# Patient Record
Sex: Female | Born: 1995 | Race: White | Hispanic: No | Marital: Single | Smoking: Never smoker
Health system: Southern US, Community
[De-identification: ages and names within clinical notes are randomized; demographics above are authoritative.]

---

## 2014-06-22 ENCOUNTER — Ambulatory Visit (INDEPENDENT_AMBULATORY_CARE_PROVIDER_SITE_OTHER): Payer: BC Managed Care – PPO | Admitting: Family Medicine

## 2014-06-22 ENCOUNTER — Ambulatory Visit (INDEPENDENT_AMBULATORY_CARE_PROVIDER_SITE_OTHER): Payer: BC Managed Care – PPO

## 2014-06-22 VITALS — BP 120/78 | HR 83 | Temp 98.3°F | Resp 16 | Ht 69.5 in | Wt 154.2 lb

## 2014-06-22 DIAGNOSIS — M24859 Other specific joint derangements of unspecified hip, not elsewhere classified: Secondary | ICD-10-CM

## 2014-06-22 DIAGNOSIS — R29898 Other symptoms and signs involving the musculoskeletal system: Secondary | ICD-10-CM

## 2014-06-22 MED ORDER — PREDNISONE 10 MG PO TABS: ORAL_TABLET | ORAL | Status: DC

## 2014-06-22 NOTE — Progress Notes (Signed)
   Subjective:    Patient ID: Jocelyn Bailey, female    DOB: [birth year], 18 y.o.   MRN: [id number]  HPI Jocelyn Bailey is a 18 year old female who presents with bilateral hip pain following an injury sustained when she was at basketball practice. She was running and doing conditioning drills. She notes that the right lateral hip "popped". After the second sprint she felt her left "hip pop". She notes that it was painful when it popped. She denies any hx of dislocated joints. She has not been doing anything new. Location of the pop was bilateral frontal and side of her hips. No known relieving factors. Aggravating factors include doing lungs or squats. She took some ibuprofen at noon, which did not help. She is a Medical laboratory scientific officer at [employer]. She also notes some bilateral lateral hip and pain along the IT bands. Denies any fevers, chills, loss of bladder or bowel, saddle anesthesia, numbness, tingling, or weakness.  Past medical, social, medications, and allergies were reviewed and are up-to-date in the chart. Review of Systems As per history of present illness, 7 point review of systems was performed is otherwise negative.    Objective:   Physical Exam BP 120/78  Pulse 83  Temp(Src) 98.3 F (36.8 C) (Oral)  Resp 16  Ht 5' 9.5" (1.765 m)  Wt 154 lb 4 oz (69.967 kg)  BMI 22.46 kg/m2  SpO2 98%  LMP 06/07/2014 GEN: The patient is well-developed well-nourished female and in no acute distress.  She is awake alert and oriented x3. SKIN: warm and well-perfused, no rash  EXTR: No lower extremity edema or calf tenderness Neuro: Strength 5/5 globally. Sensation intact throughout. DTRs 2/4 bilaterally. No focal deficits. Vasc: +2 bilateral distal pulses. No edema.  MSK: Examination of the lower extremity reveals full hip internal and external rotation is pain free bilaterally. She has some tenderness to palpation along the bilateral IT bands and over the bilateral greater trochanters just posteriorly.  Negative pelvic compression test. She also localizes some mild pain in her back around the L1-L2 lumbar spine located paraspinally. No bony tenderness. She has good strength distally.  X-ray of the pelvis was obtained which did not reveal any acute fracture or dislocation. There were no avulsion injuries injury seen.     Assessment & Plan:  1. Snapping hip syndrome, bilaterally  -Rx 6-day prednisone tapering burst. -Given patient information about Snapping Hip Syndrome -Recommend therapeutic stretching and strengthening exercises, she can discuss with her trainer -If sx persist or acutely worsen, then follow-up or go to the ED.  Dr. Joellyn Haff, DO Sports Medicine Fellow  Discussed with Dr. Conley Rolls.

## 2014-06-22 NOTE — Patient Instructions (Signed)
Medrol Dose Pak- 6 Day Course Instructions: (Prednisone 10mg tablets, #21 tablets)   Day 1 (60mg total): Take 2 tablets before breakfast, 1 tablet after lunch, 1 tablet after dinner, and 2 tablets at bedtime. *Take 6 tablets on the day when you receive your prescription, even if not able to take according to the Day 1 schedule.   Day 2 (50mg total): Take 1 tablet before breakfast, 1 tablet after lunch, 1 tablet after dinner, and 2 tablets at bedtime.  Day 3 (40mg total): Take 1 tablet before breakfast, 1 tablet after lunch, 1 tablet after dinner, and 1 tablet at bedtime.  Day 4 (30mg total): Take 1 tablet before breakfast, 1 tablet after lunch, 1 tablet after dinner.  Day 5 (20mg total): Take 1 tablet before breakfast, 1 tablet at bedtime.  Day 6 (10mg total): Take 1 tablet before breakfast. 

## 2015-01-21 ENCOUNTER — Other Ambulatory Visit (HOSPITAL_COMMUNITY)
Admission: RE | Admit: 2015-01-21 | Discharge: 2015-01-21 | Disposition: A | Discharge status: Home / Self Care | Payer: BC Managed Care – PPO | Source: Ambulatory Visit | Attending: Emergency Medicine | Admitting: Emergency Medicine

## 2015-01-21 ENCOUNTER — Emergency Department (INDEPENDENT_AMBULATORY_CARE_PROVIDER_SITE_OTHER)
Admission: EM | Admit: 2015-01-21 | Discharge: 2015-01-21 | Disposition: A | Discharge status: Home / Self Care | Payer: BC Managed Care – PPO | Source: Home / Self Care | Attending: Emergency Medicine | Admitting: Emergency Medicine

## 2015-01-21 ENCOUNTER — Encounter (HOSPITAL_COMMUNITY): Payer: Self-pay

## 2015-01-21 DIAGNOSIS — N390 Urinary tract infection, site not specified: Secondary | ICD-10-CM

## 2015-01-21 DIAGNOSIS — N760 Acute vaginitis: Secondary | ICD-10-CM | POA: Insufficient documentation

## 2015-01-21 DIAGNOSIS — Z113 Encounter for screening for infections with a predominantly sexual mode of transmission: Secondary | ICD-10-CM | POA: Diagnosis not present

## 2015-01-21 LAB — POCT URINALYSIS DIP (DEVICE)
Bilirubin Urine: NEGATIVE
GLUCOSE, UA: NEGATIVE mg/dL
KETONES UR: NEGATIVE mg/dL
NITRITE: NEGATIVE
PH: 6 (ref 5.0–8.0)
Protein, ur: 30 mg/dL — AB
Urobilinogen, UA: 0.2 mg/dL (ref 0.0–1.0)

## 2015-01-21 LAB — POCT PREGNANCY, URINE: Preg Test, Ur: NEGATIVE

## 2015-01-21 MED ORDER — CEPHALEXIN 500 MG PO CAPS: 500.0000 mg | ORAL_CAPSULE | Freq: Three times a day (TID) | ORAL | Status: DC

## 2015-01-21 NOTE — ED Notes (Signed)
States she has had prior hist of UTI, and this is worse than others. C/o frequency, pain w urination, blood in urine, pain in low back. Uses condoms for bc

## 2015-01-21 NOTE — Discharge Instructions (Signed)
It looks like you have a bladder infection. Take Keflex 3 times a day for 1 week. Make sure you're drinking plenty of water. We did STD screening labs today. We will call if anything is positive. Follow-up as needed.

## 2015-01-21 NOTE — ED Provider Notes (Signed)
CSN: [id number]     Arrival date & time 01/21/15  1411 History   None    Chief Complaint  Patient presents with  . Urinary Tract Infection   (Consider location/radiation/quality/duration/timing/severity/associated sxs/prior Treatment) HPI  She is a 19 year old woman here for evaluation of dysuria. Her symptoms have been present for 1-2 days. She reports dysuria, hematuria, urinary urgency and frequency. She also reports crampy low back pain. No flank pain. She reports some mild nausea yesterday. No fevers or chills. No vaginal discharge or discomfort.  History reviewed. No pertinent past medical history. History reviewed. No pertinent past surgical history. Family History  Problem Relation Age of Onset  . Cancer Maternal Grandmother   . Diabetes Maternal Grandfather   . Heart disease Maternal Grandfather   . Hyperlipidemia Maternal Grandfather   . Cancer Paternal Grandmother   . Cancer Paternal Grandfather   . Diabetes Paternal Grandfather   . Hyperlipidemia Paternal Grandfather   . Stroke Paternal Grandfather    History  Substance Use Topics  . Smoking status: Never Smoker   . Smokeless tobacco: Never Used  . Alcohol Use: Yes   OB History    No data available     Review of Systems As in history of present illness Allergies  Review of patient's allergies indicates no known allergies.  Home Medications   Prior to Admission medications   Medication Sig Start Date End Date Taking? Authorizing Provider  Ascorbic Acid (VITAMIN C PO) Take 1 tablet by mouth daily.    Historical Provider, MD  cephALEXin (KEFLEX) 500 MG capsule Take 1 capsule (500 mg total) by mouth 3 (three) times daily. 01/21/15   Charm RingsErin J Julena Barbour, MD  METHYLPHENIDATE HCL PO Take 1 tablet by mouth daily.    Historical Provider, MD  predniSONE (DELTASONE) 10 MG tablet Take as directed for 6 days. 06/22/14   John C Pick-Jacobs, DO   BP 123/76 mmHg  Pulse 87  Temp(Src) 98.4 F (36.9 C) (Oral)  Resp 16  SpO2 100%   LMP 01/07/2015 (Approximate) Physical Exam  Constitutional: She is oriented to person, place, and time. She appears well-developed and well-nourished. No distress.  Cardiovascular: Normal rate.   Pulmonary/Chest: Effort normal.  Abdominal: Soft.  No CVA tenderness  Genitourinary: Vagina normal. There is no rash on the right labia. There is no rash on the left labia.  Neurological: She is alert and oriented to person, place, and time.    ED Course  Procedures (including critical care time) Labs Review Labs Reviewed  POCT URINALYSIS DIP (DEVICE) - Abnormal; Notable for the following:    Hgb urine dipstick LARGE (*)    Protein, ur 30 (*)    Leukocytes, UA LARGE (*)    All other components within normal limits  URINE CULTURE  HIV ANTIBODY (ROUTINE TESTING)  RPR  POCT PREGNANCY, URINE  CERVICOVAGINAL ANCILLARY ONLY    Imaging Review No results found.   MDM   1. UTI (lower urinary tract infection)    UA concerning for UTI. Urine culture sent. We'll treat with Keflex for one week. We collected swabs and blood to screen for STDs given her age.    Charm RingsErin J Shota Kohrs, MD 01/21/15 1515

## 2015-01-22 LAB — HIV ANTIBODY (ROUTINE TESTING W REFLEX): HIV SCREEN 4TH GENERATION: NONREACTIVE

## 2015-01-22 LAB — RPR: RPR: NONREACTIVE

## 2015-01-23 LAB — URINE CULTURE

## 2015-01-24 LAB — CERVICOVAGINAL ANCILLARY ONLY
CHLAMYDIA, DNA PROBE: NEGATIVE
Neisseria Gonorrhea: NEGATIVE
Wet Prep (BD Affirm): POSITIVE — AB

## 2015-01-25 NOTE — ED Notes (Addendum)
GC/Chlamydia neg.,  Gardnerella pos., HIV/RPR non-reactive, Urine culture: Multiple bacterial types none predominant.  Message to Dr. Piedad ClimesHonig. 01/25/2015 Dr. Piedad ClimesHonig wrote that pt. had no symptoms of bacterial vaginosis.  No further action needed. 01/28/2015

## 2015-05-24 ENCOUNTER — Ambulatory Visit (INDEPENDENT_AMBULATORY_CARE_PROVIDER_SITE_OTHER): Payer: BC Managed Care – PPO | Admitting: Physician Assistant

## 2015-05-24 VITALS — BP 116/82 | HR 86 | Temp 98.5°F | Resp 18 | Ht 71.0 in | Wt 162.8 lb

## 2015-05-24 DIAGNOSIS — R309 Painful micturition, unspecified: Secondary | ICD-10-CM

## 2015-05-24 DIAGNOSIS — N3090 Cystitis, unspecified without hematuria: Secondary | ICD-10-CM

## 2015-05-24 LAB — POCT UA - MICROSCOPIC ONLY
BACTERIA, U MICROSCOPIC: NEGATIVE
CASTS, UR, LPF, POC: NEGATIVE
CRYSTALS, UR, HPF, POC: NEGATIVE
EPITHELIAL CELLS, URINE PER MICROSCOPY: NEGATIVE
MUCUS UA: NEGATIVE
RBC, URINE, MICROSCOPIC: NEGATIVE
WBC, Ur, HPF, POC: NEGATIVE
Yeast, UA: NEGATIVE

## 2015-05-24 LAB — POCT URINALYSIS DIPSTICK
BILIRUBIN UA: NEGATIVE
Blood, UA: NEGATIVE
Glucose, UA: NEGATIVE
Leukocytes, UA: NEGATIVE
Nitrite, UA: NEGATIVE
PH UA: 5.5
Spec Grav, UA: 1.025
Urobilinogen, UA: 0.2

## 2015-05-24 MED ORDER — NITROFURANTOIN MONOHYD MACRO 100 MG PO CAPS: 100.0000 mg | ORAL_CAPSULE | Freq: Two times a day (BID) | ORAL | Status: AC

## 2015-05-24 NOTE — Patient Instructions (Signed)
Drink plenty of water (at least 64 oz per day). Take antibiotic as prescribed until finished - twice a day for 7 days Start taking a cranberry pill daily. You can buy AZO over the counter and take as needed for pain. Follow directions on the packaging. I will call you with results from your urine culture. If symptoms are not improving in 7 days, return to clinic. If you develop fever, chills or vomiting at any time, return. If you get another UTI with clear urine, you will need to be referred to urology.

## 2015-05-24 NOTE — Progress Notes (Signed)
[facility] [address], [place] [address] [phone]  Date:  05/24/2015   Name:  Jocelyn Bailey   DOB:  1995/11/07   MRN:  [id number]  PCP:  No primary care provider on file.    Chief Complaint: Urinary Tract Infection   History of Present Illness:  This is a 19 y.o. female who is presenting with urinary frequency and dysuria x 2 days.  Dysuria: yes Urinary Frequency: yes Vaginal discharge: no Hematuria: no Abdominal pain: yes, lower abdomen Fever/chills: had chills but no fever Nausea/vominting: some nausea but no vomiting Back pain: no LMP: 05/03/15 Sexually active: not currently Previous UTI: yes, 1-2 per year for the past 3 years.Last had UTI sx 4 months ago. She states her urine was essentially clear at that time. She had STD testing which was negative. She was treated with abx and her sx improved. She has never seen a urologist. She has not been sexually active since last had negative STD testing. Aggravating/Alleviating factors: took aleve, helped.  Review of Systems:  Review of Systems See HPI  There are no active problems to display for this patient.   Prior to Admission medications   Medication Sig Start Date End Date Taking? Authorizing Provider         METHYLPHENIDATE HCL PO Take 1 tablet by mouth daily.   yes Historical Provider, MD    No Known Allergies  History reviewed. No pertinent past surgical history.  Social History  Substance Use Topics  . Smoking status: Never Smoker   . Smokeless tobacco: Never Used  . Alcohol Use: Yes    Family History  Problem Relation Age of Onset  . Cancer Maternal Grandmother   . Diabetes Maternal Grandfather   . Heart disease Maternal Grandfather   . Hyperlipidemia Maternal Grandfather   . Cancer Paternal Grandmother   . Cancer Paternal Grandfather   . Diabetes Paternal Grandfather   . Hyperlipidemia Paternal Grandfather   . Stroke Paternal Grandfather     Medication list has  been reviewed and updated.  Physical Examination:  Physical Exam  Constitutional: She is oriented to person, place, and time. She appears well-developed and well-nourished. No distress.  HENT:  Head: Normocephalic and atraumatic.  Right Ear: Hearing normal.  Left Ear: Hearing normal.  Nose: Nose normal.  Eyes: Conjunctivae and lids are normal. Right eye exhibits no discharge. Left eye exhibits no discharge. No scleral icterus.  Cardiovascular: Normal rate, regular rhythm, normal heart sounds and normal pulses.   No murmur heard. Pulmonary/Chest: Effort normal and breath sounds normal. No respiratory distress. She has no wheezes. She has no rhonchi. She has no rales.  Abdominal: Soft. Normal appearance. There is no tenderness. There is no CVA tenderness.  Musculoskeletal: Normal range of motion.  Neurological: She is alert and oriented to person, place, and time.  Skin: Skin is warm, dry and intact. No lesion and no rash noted.  Psychiatric: She has a normal mood and affect. Her speech is normal and behavior is normal. Thought content normal.   BP 116/82 mmHg  Pulse 86  Temp(Src) 98.5 F (36.9 C) (Oral)  Resp 18  Ht  (1.803 m)  Wt 162 lb 12.8 oz (73.846 kg)  BMI 22.72 kg/m2  SpO2 98%  LMP 05/03/2015  Results for orders placed or performed in visit on 05/24/15  POCT urinalysis dipstick  Result Value Ref Range   Color, UA Amber    Clarity, UA Slight Cloudy  Glucose, UA Negative    Bilirubin, UA Negative    Ketones, UA Trace    Spec Grav, UA 1.025    Blood, UA Negative    pH, UA 5.5    Protein, UA Trace    Urobilinogen, UA 0.2    Nitrite, UA Negative    Leukocytes, UA Negative Negative  POCT UA - Microscopic Only  Result Value Ref Range   WBC, Ur, HPF, POC Negative    RBC, urine, microscopic Negative    Bacteria, U Microscopic Negative    Mucus, UA Negative    Epithelial cells, urine per micros Negative    Crystals, Ur, HPF, POC Negative    Casts, Ur, LPF,  POC Negative    Yeast, UA Negative    Assessment and Plan:  1. Painful urination 2. cystitis UA without evidence of UTI however patient is insistent abx have always helped her in the past. STD testing negative 3-4 months ago and she has not been sexually active since. Will prescribe macrobid, urine culture pending. We discussed referral to urology however she would like to wait. If recurs and again UA neg, will refer to urology. Advise taking cranberry tabs daily for prevention. Return if sx not improved after treatment. - POCT urinalysis dipstick - POCT UA - Microscopic Only - nitrofurantoin, macrocrystal-monohydrate, (MACROBID) 100 MG capsule; Take 1 capsule (100 mg total) by mouth 2 (two) times daily.  Dispense: 14 capsule; Refill: 0 - Urine culture   [place] [employer]. Dyke Brackett, MHS [facility]  05/24/2015

## 2015-05-25 LAB — URINE CULTURE
Colony Count: NO GROWTH
ORGANISM ID, BACTERIA: NO GROWTH

## 2015-11-09 IMAGING — CR DG PELVIS 1-2V
1 series · 1 of 1 positions shown · non-contrast
Comparison: None.

CLINICAL DATA: Snapping hip syndrome

EXAM:
PELVIS - 1-2 VIEW

[AP]
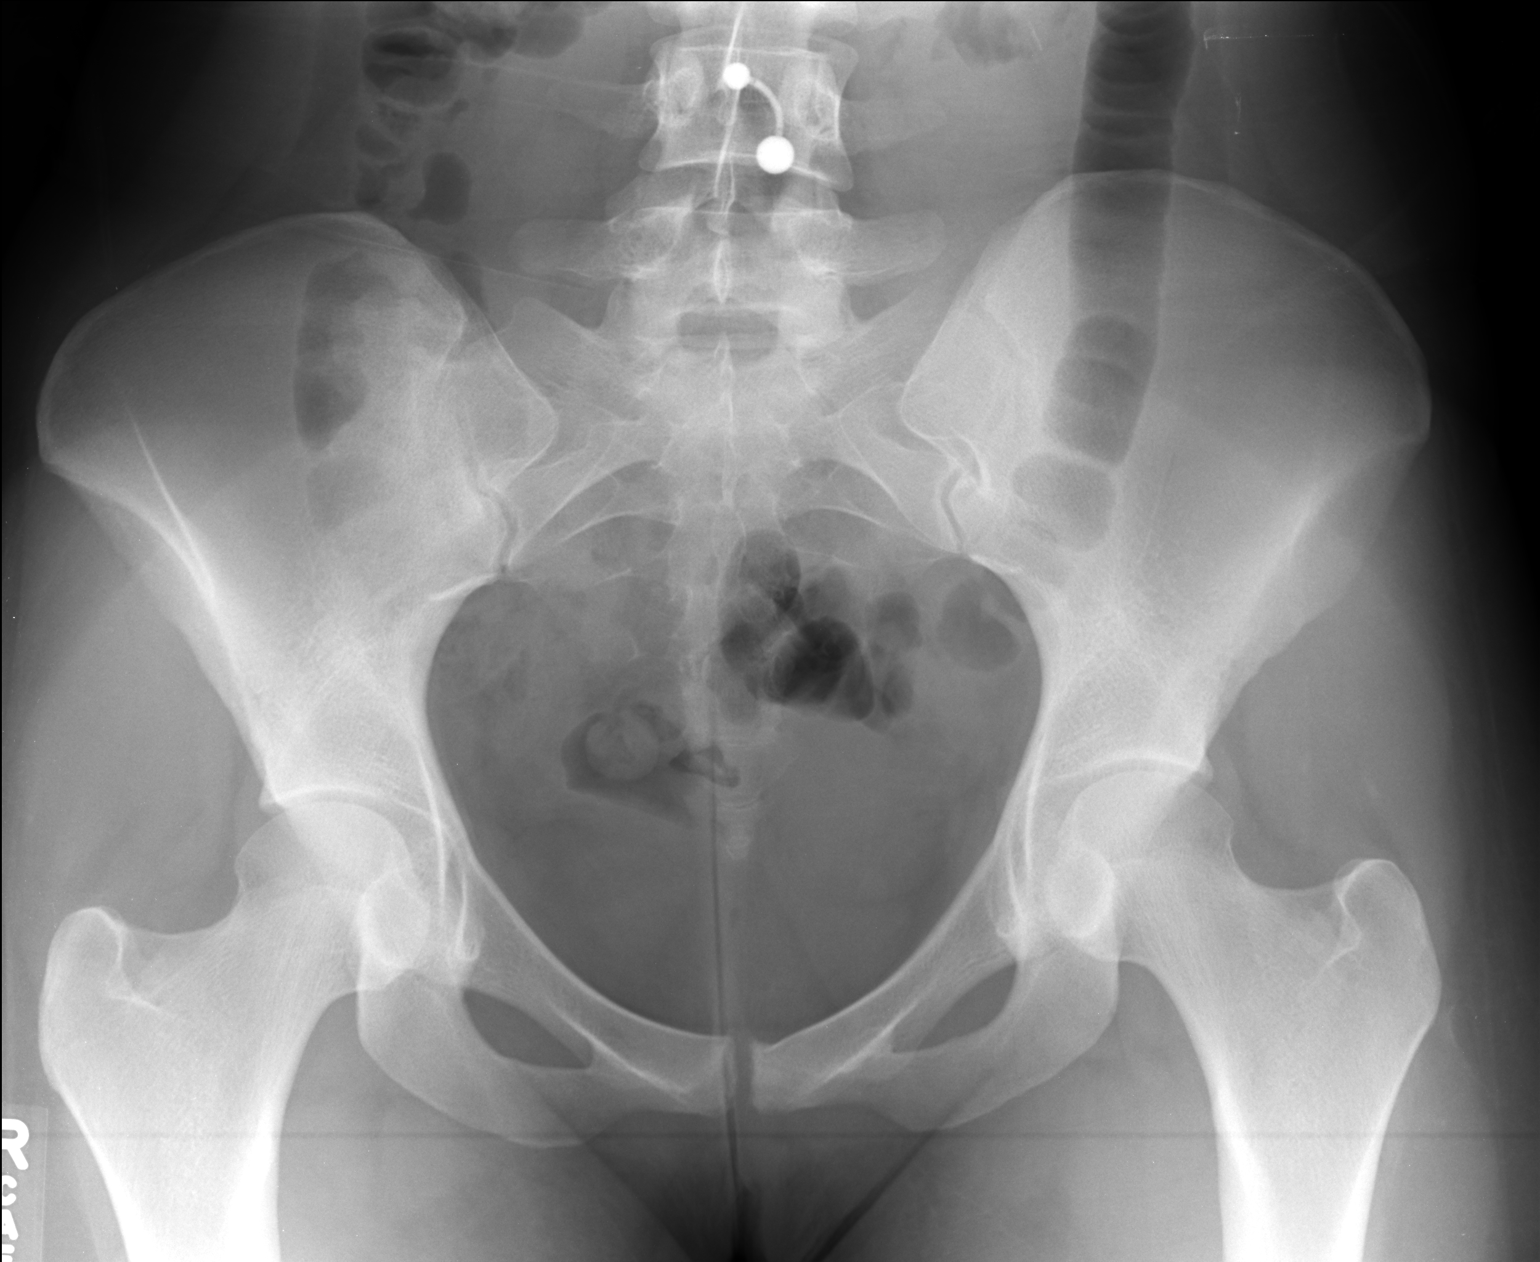

[1 of 1 positions shown; findings below may reference images not displayed]

FINDINGS: There is no evidence of pelvic fracture or diastasis. No pelvic bone
lesions are seen.
IMPRESSION: Negative.
